# Patient Record
Sex: Female | Born: 1985
Health system: Southern US, Community
[De-identification: ages and names within clinical notes are randomized; demographics above are authoritative.]

## PROBLEM LIST (undated history)

## (undated) DIAGNOSIS — Z803 Family history of malignant neoplasm of breast: Secondary | ICD-10-CM

## (undated) HISTORY — DX: Family history of malignant neoplasm of breast: Z80.3

---

## 2017-08-19 LAB — CBC AND DIFFERENTIAL
HCT: 41 (ref 36–46)
HEMOGLOBIN: 13.4 (ref 12.0–16.0)
Platelets: 281 (ref 150–399)
WBC: 6

## 2017-08-19 LAB — BASIC METABOLIC PANEL
BUN: 8 (ref 4–21)
Creatinine: 0.8 (ref 0.5–1.1)
Glucose: 88
POTASSIUM: 4.5 (ref 3.4–5.3)
Sodium: 140 (ref 137–147)

## 2017-08-19 LAB — LIPID PANEL
CHOLESTEROL: 156 (ref 0–200)
HDL: 65 (ref 35–70)
LDL Cholesterol: 79
TRIGLYCERIDES: 59 (ref 40–160)

## 2017-08-19 LAB — HEPATIC FUNCTION PANEL
ALT: 13 (ref 7–35)
AST: 21 (ref 13–35)
Alkaline Phosphatase: 51 (ref 25–125)
Bilirubin, Total: 0.4

## 2017-08-19 LAB — TSH: TSH: 2.73 (ref 0.41–5.90)

## 2017-11-01 ENCOUNTER — Ambulatory Visit: Payer: 59 | Admitting: Family Medicine

## 2017-11-01 ENCOUNTER — Encounter: Payer: Self-pay | Admitting: Family Medicine

## 2017-11-01 VITALS — BP 110/70 | HR 71 | Temp 97.8°F | Ht 67.0 in | Wt 196.2 lb

## 2017-11-01 DIAGNOSIS — Z0000 Encounter for general adult medical examination without abnormal findings: Secondary | ICD-10-CM | POA: Insufficient documentation

## 2017-11-01 NOTE — Progress Notes (Signed)
Subjective:  Patient ID: Megan Marks, female    DOB: May 07, 1986  Age: 32 y.o. MRN: [id number]  CC: Establish Care   HPI Megan Marks presents for establishment of care and a physical exam.  Patient enjoys good health and has no history of chronic illness.  She is on no medications.  She is a Investment banker, operational.  She has a significant other.  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  She does not smoke use illicit drugs.  She exercises on a regular basis with spending and weightlifting.  She averages about 5000 steps daily in her job.  She is planning on training to run a 5K.  Her mother has elevated cholesterol.  Her father has diabetes and hypertension.  She is planning on seeing her GYN provider for her Pap and pelvic exams. History Megan Marks has no past medical history on file.   She has no past surgical history on file.   Her family history is not on file.She reports that she has never smoked. She has never used smokeless tobacco. Her alcohol and drug histories are not on file.  No outpatient medications prior to visit.   No facility-administered medications prior to visit.     ROS Review of Systems  Constitutional: Negative.   HENT: Negative.   Eyes: Negative.   Respiratory: Negative.   Cardiovascular: Negative.   Gastrointestinal: Negative.   Endocrine: Negative.   Genitourinary: Negative.   Musculoskeletal: Negative.   Allergic/Immunologic: Negative for immunocompromised state.  Neurological: Negative.   Hematological: Negative.   Psychiatric/Behavioral: Negative.     Objective:  BP 110/70   Pulse 71   Temp 97.8 F (36.6 C)   Ht 5\' 7"  (1.702 m)   Wt 196 lb 4 oz (89 kg)   LMP 10/12/2017 (Exact Date)   SpO2 97%   BMI 30.74 kg/m   Physical Exam  Constitutional: She is oriented to person, place, and time. She appears well-developed and well-nourished. No distress.  HENT:  Head:  Normocephalic and atraumatic.  Right Ear: External ear normal.  Left Ear: External ear normal.  Mouth/Throat: Oropharynx is clear and moist. No oropharyngeal exudate.  Eyes: Pupils are equal, round, and reactive to light. Conjunctivae and EOM are normal. Right eye exhibits no discharge. Left eye exhibits no discharge. No scleral icterus.  Neck: Normal range of motion. Neck supple. No JVD present. No tracheal deviation present. No thyromegaly present.  Cardiovascular: Normal rate, regular rhythm and normal heart sounds.  Pulmonary/Chest: Effort normal and breath sounds normal.  Abdominal: Bowel sounds are normal.  Musculoskeletal: She exhibits no edema or tenderness.  Lymphadenopathy:    She has no cervical adenopathy.  Neurological: She is alert and oriented to person, place, and time.  Skin: Skin is warm and dry. Capillary refill takes less than 2 seconds. She is not diaphoretic.  Psychiatric: She has a normal mood and affect. Her behavior is normal. Thought content normal.      Assessment & Plan:   Megan Marks was seen today for establish care.  Diagnoses and all orders for this visit:  Healthcare maintenance   Megan Marks does not currently have medications on file.  No orders of the defined types were placed in this encounter.  Patient brings  recent blood work drawn through work that shows normal CMP, CBC and TSH.  LDL was 79 and her HDL was 65.  Encouraged her to maintain her healthy lifestyle.  She  will follow-up in a year or as needed.  Follow-up: Return in about 1 year (around 11/02/2018), or if symptoms worsen or fail to improve.  Mliss SaxWilliam Alfred Kremer, MD

## 2017-11-22 ENCOUNTER — Other Ambulatory Visit: Payer: Self-pay | Admitting: Obstetrics and Gynecology

## 2018-07-12 NOTE — Progress Notes (Signed)
PCP:  Libby Maw, MD   Chief Complaint  Patient presents with  . Gynecologic Exam     HPI:      [name] is a 33 y.o. No obstetric history on file. who LMP was Patient's last menstrual period was 07/11/2018 (exact date)., presents today for her NP annual examination.  Her menses are Q31 days (this month was 25 days), lasting 7 days.  Dysmenorrhea mild, occurring first 1-2 days of flow. She does not have intermenstrual bleeding.  Sex activity: single partner, contraception - condoms, declines other BC. Last Pap: 3-4 yrs ago; no hx of abn.   Last mammo: none There is a FH of breast cancer in her mom and MGM, neg BRCA genetic testing in her mom about 10 yrs ago. There is no FH of ovarian cancer. The patient does do self-breast exams.  Tobacco use: The patient denies current or previous tobacco use. Alcohol use: social drinker No drug use.  Exercise: moderately active  She does get adequate calcium and Vitamin D in her diet.  Normal labs 4/19. Would like new Gard vaccine due to poss exposure with LEEP procedures.   Past Medical History:  Diagnosis Date  . Family history of breast cancer     History reviewed. No pertinent surgical history.  Family History  Problem Relation Age of Onset  . Breast cancer Mother 20       tested  . Kidney cancer Mother 67  . Hypertension Mother   . Hyperlipidemia Mother   . Hypertension Father   . Breast cancer Maternal Grandmother 8  . Prostate cancer Maternal Grandfather 62  . Lymphoma Paternal Grandmother 36  . Heart failure Paternal Grandfather     Social History   Socioeconomic History  . Marital status: Single    Spouse name: Not on file  . Number of children: Not on file  . Years of education: Not on file  . Highest education level: Not on file  Occupational History  . Not on file  Social Needs  . Financial resource strain: Not on file  . Food insecurity:    Worry: Not on file    Inability:  Not on file  . Transportation needs:    Medical: Not on file    Non-medical: Not on file  Tobacco Use  . Smoking status: Never Smoker  . Smokeless tobacco: Never Used  Substance and Sexual Activity  . Alcohol use: Yes    Alcohol/week: 1.0 standard drinks    Types: 1 Glasses of wine per week    Comment: rarely  . Drug use: Never  . Sexual activity: Yes    Birth control/protection: Condom  Lifestyle  . Physical activity:    Days per week: Not on file    Minutes per session: Not on file  . Stress: Not on file  Relationships  . Social connections:    Talks on phone: Not on file    Gets together: Not on file    Attends religious service: Not on file    Active member of club or organization: Not on file    Attends meetings of clubs or organizations: Not on file    Relationship status: Not on file  . Intimate partner violence:    Fear of current or ex partner: Not on file    Emotionally abused: Not on file    Physically abused: Not on file    Forced sexual activity: Not on file  Other Topics Concern  .  Not on file  Social History Narrative  . Not on file    No outpatient medications prior to visit.   No facility-administered medications prior to visit.       ROS:  Review of Systems  Constitutional: Negative for fatigue, fever and unexpected weight change.  Respiratory: Negative for cough, shortness of breath and wheezing.   Cardiovascular: Negative for chest pain, palpitations and leg swelling.  Gastrointestinal: Negative for blood in stool, constipation, diarrhea, nausea and vomiting.  Endocrine: Negative for cold intolerance, heat intolerance and polyuria.  Genitourinary: Negative for dyspareunia, dysuria, flank pain, frequency, genital sores, hematuria, menstrual problem, pelvic pain, urgency, vaginal bleeding, vaginal discharge and vaginal pain.  Musculoskeletal: Negative for back pain, joint swelling and myalgias.  Skin: Negative for rash.  Neurological:  Negative for dizziness, syncope, light-headedness, numbness and headaches.  Hematological: Negative for adenopathy.  Psychiatric/Behavioral: Negative for agitation, confusion, sleep disturbance and suicidal ideas. The patient is not nervous/anxious.    BREAST: No symptoms   Objective: BP 120/84   Pulse 76   Ht 5' 7"  (1.702 m)   Wt 199 lb (90.3 kg)   LMP 07/11/2018 (Exact Date)   BMI 31.17 kg/m    Physical Exam Constitutional:      Appearance: She is well-developed.  Genitourinary:     Vulva, uterus, right adnexa and left adnexa normal.     No vulval lesion or tenderness noted.     No vaginal discharge, erythema or tenderness.     Cervical bleeding present.     No cervical polyp.     Uterus is not enlarged or tender.     No right or left adnexal mass present.     Right adnexa not tender.     Left adnexa not tender.  Neck:     Musculoskeletal: Normal range of motion.     Thyroid: No thyromegaly.  Cardiovascular:     Rate and Rhythm: Normal rate and regular rhythm.     Heart sounds: Normal heart sounds. No murmur.  Pulmonary:     Effort: Pulmonary effort is normal.     Breath sounds: Normal breath sounds.  Chest:     Breasts:        Right: No mass, nipple discharge, skin change or tenderness.        Left: No mass, nipple discharge, skin change or tenderness.  Abdominal:     Palpations: Abdomen is soft.     Tenderness: There is no abdominal tenderness. There is no guarding.  Musculoskeletal: Normal range of motion.  Neurological:     Mental Status: She is alert and oriented to person, place, and time.     Cranial Nerves: No cranial nerve deficit.  Psychiatric:        Behavior: Behavior normal.  Vitals signs reviewed.     Assessment/Plan: Encounter for annual routine gynecological examination  Cervical cancer screening - Plan: Cytology - PAP  Screening for HPV (human papillomavirus) - Plan: Cytology - PAP  Family history of breast cancer - Mom is BRCA neg.  MyRisk testing offered and discussed. Pt to consider. F/u if desires  Increased risk of breast cancer - TC model done. IBIS=32.6%. Discussed yearly mammos starting age 69, alternating with scr br MRI. Pt to consider and f/u if desires.  Need for HPV vaccine - Plan: HPV vaccine quadravalent 3 dose IM           GYN counsel breast self exam, mammography screening, adequate intake of calcium and vitamin D,  diet and exercise     F/U  Return in about 2 months (around 09/12/2018).  Arcadio Cope B. Niemah Schwebke, PA-C 07/14/2018 10:59 AM

## 2018-07-14 ENCOUNTER — Encounter: Payer: Self-pay | Admitting: Obstetrics and Gynecology

## 2018-07-14 ENCOUNTER — Ambulatory Visit (INDEPENDENT_AMBULATORY_CARE_PROVIDER_SITE_OTHER): Payer: 59 | Admitting: Obstetrics and Gynecology

## 2018-07-14 ENCOUNTER — Other Ambulatory Visit (HOSPITAL_COMMUNITY)
Admission: RE | Admit: 2018-07-14 | Discharge: 2018-07-14 | Disposition: A | Discharge status: Home / Self Care | Payer: 59 | Source: Ambulatory Visit | Attending: Obstetrics and Gynecology | Admitting: Obstetrics and Gynecology

## 2018-07-14 VITALS — BP 120/84 | HR 76 | Ht 67.0 in | Wt 199.0 lb

## 2018-07-14 DIAGNOSIS — Z01419 Encounter for gynecological examination (general) (routine) without abnormal findings: Secondary | ICD-10-CM

## 2018-07-14 DIAGNOSIS — Z1151 Encounter for screening for human papillomavirus (HPV): Secondary | ICD-10-CM | POA: Diagnosis not present

## 2018-07-14 DIAGNOSIS — Z124 Encounter for screening for malignant neoplasm of cervix: Secondary | ICD-10-CM | POA: Insufficient documentation

## 2018-07-14 DIAGNOSIS — Z9189 Other specified personal risk factors, not elsewhere classified: Secondary | ICD-10-CM

## 2018-07-14 DIAGNOSIS — Z23 Encounter for immunization: Secondary | ICD-10-CM

## 2018-07-14 DIAGNOSIS — Z803 Family history of malignant neoplasm of breast: Secondary | ICD-10-CM

## 2018-07-14 NOTE — Patient Instructions (Signed)
I value your feedback and entrusting us with your care. If you get a [facility] patient survey, I would appreciate you taking the time to let us know about your experience today. Thank you! 

## 2018-07-17 LAB — CYTOLOGY - PAP
Diagnosis: NEGATIVE
HPV: NOT DETECTED

## 2018-07-25 ENCOUNTER — Other Ambulatory Visit: Payer: Self-pay | Admitting: Maternal Newborn

## 2018-07-25 DIAGNOSIS — N760 Acute vaginitis: Principal | ICD-10-CM

## 2018-07-25 DIAGNOSIS — B9689 Other specified bacterial agents as the cause of diseases classified elsewhere: Secondary | ICD-10-CM

## 2018-07-25 MED ORDER — SECNIDAZOLE 2 G PO PACK: 1.0000 | PACK | Freq: Once | ORAL | 0 refills | Status: DC

## 2018-09-18 ENCOUNTER — Ambulatory Visit (INDEPENDENT_AMBULATORY_CARE_PROVIDER_SITE_OTHER): Payer: 59

## 2018-09-18 ENCOUNTER — Other Ambulatory Visit: Payer: Self-pay

## 2018-09-18 DIAGNOSIS — Z23 Encounter for immunization: Secondary | ICD-10-CM

## 2018-12-29 DIAGNOSIS — L858 Other specified epidermal thickening: Secondary | ICD-10-CM | POA: Diagnosis not present

## 2018-12-29 DIAGNOSIS — B078 Other viral warts: Secondary | ICD-10-CM | POA: Diagnosis not present

## 2018-12-29 DIAGNOSIS — L817 Pigmented purpuric dermatosis: Secondary | ICD-10-CM | POA: Diagnosis not present

## 2019-01-28 ENCOUNTER — Ambulatory Visit (INDEPENDENT_AMBULATORY_CARE_PROVIDER_SITE_OTHER): Payer: 59

## 2019-01-28 ENCOUNTER — Other Ambulatory Visit: Payer: Self-pay

## 2019-01-28 DIAGNOSIS — Z23 Encounter for immunization: Secondary | ICD-10-CM

## 2019-02-11 ENCOUNTER — Other Ambulatory Visit: Payer: Self-pay | Admitting: Obstetrics and Gynecology

## 2019-03-09 DIAGNOSIS — B070 Plantar wart: Secondary | ICD-10-CM | POA: Diagnosis not present

## 2019-03-09 DIAGNOSIS — L858 Other specified epidermal thickening: Secondary | ICD-10-CM | POA: Diagnosis not present

## 2019-04-07 DIAGNOSIS — L858 Other specified epidermal thickening: Secondary | ICD-10-CM | POA: Diagnosis not present

## 2019-04-07 DIAGNOSIS — B070 Plantar wart: Secondary | ICD-10-CM | POA: Diagnosis not present

## 2019-05-04 DIAGNOSIS — B079 Viral wart, unspecified: Secondary | ICD-10-CM | POA: Diagnosis not present

## 2019-05-04 DIAGNOSIS — L858 Other specified epidermal thickening: Secondary | ICD-10-CM | POA: Diagnosis not present

## 2019-07-13 ENCOUNTER — Ambulatory Visit: Payer: 59 | Admitting: Obstetrics and Gynecology

## 2019-07-13 ENCOUNTER — Other Ambulatory Visit (HOSPITAL_COMMUNITY)
Admission: RE | Admit: 2019-07-13 | Discharge: 2019-07-13 | Disposition: A | Discharge status: Home / Self Care | Payer: 59 | Source: Ambulatory Visit | Attending: Obstetrics and Gynecology | Admitting: Obstetrics and Gynecology

## 2019-07-13 ENCOUNTER — Other Ambulatory Visit: Payer: Self-pay

## 2019-07-13 ENCOUNTER — Other Ambulatory Visit: Payer: Self-pay | Admitting: Obstetrics and Gynecology

## 2019-07-13 DIAGNOSIS — Z113 Encounter for screening for infections with a predominantly sexual mode of transmission: Secondary | ICD-10-CM | POA: Insufficient documentation

## 2019-07-13 DIAGNOSIS — N930 Postcoital and contact bleeding: Secondary | ICD-10-CM

## 2019-07-13 MED ORDER — DOXYCYCLINE HYCLATE 100 MG PO CAPS: 100.0000 mg | ORAL_CAPSULE | Freq: Two times a day (BID) | ORAL | 0 refills | Status: DC

## 2019-07-13 NOTE — Progress Notes (Signed)
Pt with a couple episodes red postcoital bleeding, no dyspareunia. No other vag sx. Pap neg/neg HPV DAN 2/20. No new sex partners, using condoms. Wants STD testing. Menses monthly, no AUBs.  Cx bled with cervical brush, no endocx polyp at os. Neg pelvic exam. Check STDs, treat with doxy. Rx eRxd. F/u prn.

## 2019-07-15 LAB — CERVICOVAGINAL ANCILLARY ONLY
Chlamydia: NEGATIVE
Comment: NEGATIVE
Comment: NORMAL
Neisseria Gonorrhea: NEGATIVE

## 2019-11-02 ENCOUNTER — Telehealth: Payer: Self-pay | Admitting: Family Medicine

## 2019-11-02 NOTE — Telephone Encounter (Signed)
Patient sent mychart request to schedule for annual. Called patient and LVM for patient to call the office.

## 2019-11-24 ENCOUNTER — Other Ambulatory Visit: Payer: Self-pay

## 2019-11-25 ENCOUNTER — Ambulatory Visit (INDEPENDENT_AMBULATORY_CARE_PROVIDER_SITE_OTHER): Payer: 59 | Admitting: Family Medicine

## 2019-11-25 ENCOUNTER — Encounter: Payer: Self-pay | Admitting: Family Medicine

## 2019-11-25 VITALS — BP 112/78 | HR 69 | Temp 98.1°F | Ht 67.25 in | Wt 203.4 lb

## 2019-11-25 DIAGNOSIS — F439 Reaction to severe stress, unspecified: Secondary | ICD-10-CM

## 2019-11-25 NOTE — Progress Notes (Signed)
New Patient Office Visit  Subjective:  Patient ID: Megan Marks, female    DOB: 05-16-1986  Age: 34 y.o. MRN: [id number]  CC:  Chief Complaint  Patient presents with  . Annual Exam    Patient is here today for a CPE. She was seen by GYN on 2.22.21. She brought in a copy of her fasting labs completed on 9.23.2020.  She is UTD with all vaccines. She is not currently fasting.    HPI Megan Marks presents for an exam and a discussion of personal stress.  Recent GYN exam in February.  Brings in lab values obtained at that time.  Normal CMP LDL cholesterol 85 with an HDL of 53.  CBC was normal.  Patient is exercising.  She lives with her significant other and they have a good relationship.  She does not smoke or use illicit drugs.  She rarely drinks alcohol.  Past Medical History:  Diagnosis Date  . Family history of breast cancer     No past surgical history on file.  Family History  Problem Relation Age of Onset  . Breast cancer Mother 91       tested  . Kidney cancer Mother 38  . Hypertension Mother   . Hyperlipidemia Mother   . Hypertension Father   . Breast cancer Maternal Grandmother 37  . Prostate cancer Maternal Grandfather 61  . Lymphoma Paternal Grandmother 15  . Heart failure Paternal Grandfather     Social History   Socioeconomic History  . Marital status: Single    Spouse name: Not on file  . Number of children: Not on file  . Years of education: Not on file  . Highest education level: Not on file  Occupational History  . Not on file  Tobacco Use  . Smoking status: Never Smoker  . Smokeless tobacco: Never Used  Vaping Use  . Vaping Use: Never used  Substance and Sexual Activity  . Alcohol use: Yes    Alcohol/week: 1.0 standard drink    Types: 1 Glasses of wine per week    Comment: rarely  . Drug use: Never  . Sexual activity: Yes    Birth control/protection: Condom  Other Topics Concern  . Not on file  Social History Narrative  . Not  on file   Social Determinants of Health   Financial Resource Strain:   . Difficulty of Paying Living Expenses:   Food Insecurity:   . Worried About Programme researcher, broadcasting/film/video in the Last Year:   . Barista in the Last Year:   Transportation Needs:   . Freight forwarder (Medical):   Marland Kitchen Lack of Transportation (Non-Medical):   Physical Activity:   . Days of Exercise per Week:   . Minutes of Exercise per Session:   Stress:   . Feeling of Stress :   Social Connections:   . Frequency of Communication with Friends and Family:   . Frequency of Social Gatherings with Friends and Family:   . Attends Religious Services:   . Active Member of Clubs or Organizations:   . Attends Banker Meetings:   Marland Kitchen Marital Status:   Intimate Partner Violence:   . Fear of Current or Ex-Partner:   . Emotionally Abused:   Marland Kitchen Physically Abused:   . Sexually Abused:     ROS Review of Systems  Constitutional: Negative.   HENT: Negative.   Eyes: Negative for photophobia and visual disturbance.  Respiratory: Negative.   Cardiovascular:  Negative.   Gastrointestinal: Negative.   Endocrine: Negative for polyphagia and polyuria.  Genitourinary: Negative.   Musculoskeletal: Negative for gait problem and joint swelling.  Skin: Negative for pallor and rash.   Depression screen PHQ 2/9 11/25/2019  Decreased Interest 1  Down, Depressed, Hopeless 1  PHQ - 2 Score 2  Altered sleeping 0  Tired, decreased energy 1  Change in appetite 0  Feeling bad or failure about yourself  1  Trouble concentrating 0  Moving slowly or fidgety/restless 0  Suicidal thoughts 0  PHQ-9 Score 4  Difficult doing work/chores Not difficult at all    Objective:   Today's Vitals: BP 112/78 (BP Location: Left Arm, Patient Position: Sitting, Cuff Size: Normal)   Pulse 69   Temp 98.1 F (36.7 C) (Temporal)   Ht 5' 7.25" (1.708 m)   Wt 203 lb 6.4 oz (92.3 kg)   LMP 11/09/2019   SpO2 97%   BMI 31.62 kg/m    Physical Exam Vitals and nursing note reviewed.  Constitutional:      General: She is not in acute distress.    Appearance: Normal appearance. She is not ill-appearing, toxic-appearing or diaphoretic.  HENT:     Head: Normocephalic and atraumatic.     Right Ear: External ear normal.     Left Ear: External ear normal.  Eyes:     General: No scleral icterus.       Right eye: No discharge.        Left eye: No discharge.     Extraocular Movements: Extraocular movements intact.     Conjunctiva/sclera: Conjunctivae normal.  Pulmonary:     Effort: Pulmonary effort is normal.  Neurological:     Mental Status: She is alert and oriented to person, place, and time.  Psychiatric:        Mood and Affect: Mood normal.        Behavior: Behavior normal.     Assessment & Plan:   Problem List Items Addressed This Visit      Other   Stress - Primary      Outpatient Encounter Medications as of 11/25/2019  Medication Sig  . doxycycline (VIBRAMYCIN) 100 MG capsule Take 1 capsule (100 mg total) by mouth 2 (two) times daily.   No facility-administered encounter medications on file as of 11/25/2019.    Follow-up: Return if symptoms worsen or fail to improve.   We discussed coping skills including talking therapy and medications.  We will hold off for now.  We developed a plan of action for her and she will pursue that.  Mliss Sax, MD

## 2019-11-30 ENCOUNTER — Other Ambulatory Visit: Payer: Self-pay

## 2019-11-30 ENCOUNTER — Encounter: Payer: Self-pay | Admitting: Obstetrics and Gynecology

## 2019-11-30 ENCOUNTER — Ambulatory Visit (INDEPENDENT_AMBULATORY_CARE_PROVIDER_SITE_OTHER): Payer: 59 | Admitting: Obstetrics and Gynecology

## 2019-11-30 VITALS — BP 118/79 | HR 64 | Ht 67.0 in | Wt 202.5 lb

## 2019-11-30 DIAGNOSIS — N898 Other specified noninflammatory disorders of vagina: Secondary | ICD-10-CM

## 2019-11-30 DIAGNOSIS — K644 Residual hemorrhoidal skin tags: Secondary | ICD-10-CM

## 2019-11-30 DIAGNOSIS — N9089 Other specified noninflammatory disorders of vulva and perineum: Secondary | ICD-10-CM | POA: Diagnosis not present

## 2019-11-30 MED ORDER — HYDROCORTISONE ACETATE 25 MG RE SUPP: 25.0000 mg | Freq: Two times a day (BID) | RECTAL | 1 refills | Status: DC

## 2019-11-30 MED ORDER — CLOBETASOL PROPIONATE 0.05 % EX OINT: TOPICAL_OINTMENT | CUTANEOUS | 1 refills | Status: DC

## 2019-11-30 NOTE — Progress Notes (Signed)
GYNECOLOGY PROGRESS NOTE  Subjective:    Patient ID: Megan Marks, female    DOB: 1985-10-26, 34 y.o.   MRN: [id number]  HPI  Patient is a 34 y.o. G0P0000 female who presents for complaints of vulvar irritation for the past 1.5 months, with worsening symptoms over the past several weeks. She initially thought it might be a yeast infection or BV infection (had some occasional mild vaginal irritation), and so she self-treated, however her symptoms did not go away. 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  She reports that her vulva becomes more irritated in the evenings, and worsens after scratching.  She has been attempting to manage symptoms currently with Benadryl, Vaseline, neosporin, powder, and even ice with no relief.  It is now affecting her life daily.   Additionally, she reports that she has been dealing with hemorrhoids. She tries to consume more water to reduce risk of constipation, topically applies Preparation-H, but with her vulvar symptoms, feels like this may be decreasing the response.    GYN History:  Patient's last menstrual period was 11/26/2019. Denies h/o STI's. Currently in a relationship, same partner (female x 11 years) Last pap smear: 07/14/2018. Results were: normal.    OB History  Gravida Para Term Preterm AB Living  0 0 0 0 0 0  SAB TAB Ectopic Multiple Live Births  0 0 0 0 0     Past Medical History:  Diagnosis Date  . Family history of breast cancer     Family History  Problem Relation Age of Onset  . Breast cancer Mother 70       tested  . Kidney cancer Mother 45  . Hypertension Mother   . Hyperlipidemia Mother   . Hypertension Father   . Breast cancer Maternal Grandmother 38  . Prostate cancer Maternal Grandfather 18  . Lymphoma Paternal Grandmother 96    . Heart failure Paternal Grandfather     History reviewed. No pertinent surgical history.  Social History   Socioeconomic History  . Marital status: Single    Spouse name: Not on file  . Number of children: Not on file  . Years of education: Not on file  . Highest education level: Not on file  Occupational History  . Not on file  Tobacco Use  . Smoking status: Never Smoker  . Smokeless tobacco: Never Used  Vaping Use  . Vaping Use: Never used  Substance and Sexual Activity  . Alcohol use: Yes    Alcohol/week: 1.0 standard drink    Types: 1 Glasses of wine per week    Comment: rarely  . Drug use: Never  . Sexual activity: Yes    Birth control/protection: Condom  Other Topics Concern  . Not on file  Social History Narrative  . Not on file   Social Determinants of Health   Financial Resource Strain:   . Difficulty of Paying Living Expenses:   Food Insecurity:   . Worried About Programme researcher, broadcasting/film/video in the Last Year:   . Barista in the Last Year:   Transportation Needs:   . Freight forwarder (Medical):   Marland Kitchen Lack of Transportation (Non-Medical):   Physical Activity:   . Days of Exercise per Week:   . Minutes of  Exercise per Session:   Stress:   . Feeling of Stress :   Social Connections:   . Frequency of Communication with Friends and Family:   . Frequency of Social Gatherings with Friends and Family:   . Attends Religious Services:   . Active Member of Clubs or Organizations:   . Attends Banker Meetings:   Marland Kitchen Marital Status:   Intimate Partner Violence:   . Fear of Current or Ex-Partner:   . Emotionally Abused:   Marland Kitchen Physically Abused:   . Sexually Abused:     No current outpatient medications on file prior to visit.   No current facility-administered medications on file prior to visit.    Allergies  Allergen Reactions  . Sulfa Antibiotics Other (See Comments)    As child- rash?    Review of Systems Pertinent items noted in  HPI and remainder of comprehensive ROS otherwise negative.   Objective:   Blood pressure 118/79, pulse 64, height 5\' 7"  (1.702 m), weight 202 lb 8 oz (91.9 kg), last menstrual period 11/26/2019. General appearance: alert and no acute distress Pelvis: external genitalia with erythematous rash of bilateral labia minora and majora, extending to rectal area. Several small fissures noted at perineum (posterior fourchette) and on left labia minora. Several scattered papules along the edges of erythematous regions of the labia majora bilaterally extending towards the groin. Hemorrhoid skin tag noted at 12 o'clock on rectum. Speculum and bimanual exam deferred (Diva cup in place).  Extremities: extremities normal, atraumatic, no cyanosis or edema  Assessment:   Vulvar irritation Vaginal irritation Hemorrhoids  Plan:   1)  Vulvar irritation - appears to be some form of vulvar dermatosis, suggestive most of lichen simplex chronicus by visualization and history of chronic "scratch/itch" cycles. Will prescribe Clobetasol for treatment. Advised on BID use for 1-2 weeks, then decrease to daily as needed.  2) Vaginal irritation - noted on occasion, attempted to self-treat for BV and yeast.  Not having symptoms currently but would like to be checked.  Allowed patient to self-collect Nuswab as she noted that she had her Diva cup in place as she is currently menstruating. Is not concerned for STD exposure, declines testing.  3. Patient can continue use of Preparation-H, can utilize warm compresses/sitz baths. Will also prescribe Anusol in case OTC remedies are no longer effective.  Patient to f/u in 1 month to reassess symptoms and repeat vulvar exam to assess for clinical improvement.    01/27/2020, MD [facility]

## 2019-11-30 NOTE — Patient Instructions (Signed)
Lichen Simplex Chronicus Lichen sclerosus is a skin problem. It can happen on any part of the body, but it commonly involves the anal or genital areas. It can cause itching and discomfort in these areas. Treatment can help to control symptoms. When the genital area is affected, getting treatment is important because the condition can cause scarring that may lead to other problems. What are the causes? The cause of this condition is not known. It may be related to an overactive immune system or a lack of certain hormones. Lichen sclerosus is not an infection or a fungus, and it is not passed from one person to another (not contagious). What increases the risk? This condition is more likely to develop in women, usually after menopause. What are the signs or symptoms? Symptoms of this condition include:  Thin, wrinkled, white areas on the skin.  Thickened white areas on the skin.  Red and swollen patches (lesions) on the skin.  Tears or cracks in the skin.  Bruising.  Blood blisters.  Severe itching.  Pain, itching, or burning when urinating. Constipation is also common in people with lichen sclerosus. How is this diagnosed? This condition may be diagnosed with a physical exam. In some cases, a tissue sample (biopsy sample) may be removed to be looked at under a microscope. How is this treated? This condition is usually treated with medicated creams or ointments (topical steroids) that are applied over the affected areas. In some cases, treatment may also include medicines that are taken by mouth. Surgery may be needed in more severe cases that are causing problems such as scarring. Follow these instructions at home:  Take or use over-the-counter and prescription medicines only as told by your health care provider.  Use creams or ointments as told by your health care provider.  Do not scratch the affected areas of skin.  If you are a woman, be sure to keep the vaginal area as clean  and dry as possible.  Clean the affected area of skin gently with water. Avoid using rough towels or toilet paper.  Keep all follow-up visits as told by your health care provider. This is important. Contact a health care provider if:  You have increasing redness, swelling, or pain in the affected area.  You have fluid, blood, or pus coming from the affected area.  You have new lesions on your skin.  You have a fever.  You have pain during sex. Summary  Lichen sclerosus is a skin problem. When the genital area is affected, getting treatment is important because the condition can cause scarring that may lead to other problems.  This condition is usually treated with medicated creams or ointments (topical steroids) that are applied over the affected areas.  Take or use over-the-counter and prescription medicines only as told by your health care provider.  Contact a health care provider if you have new lesions on your skin, have pain during sex, or have increasing redness, swelling, or pain in the affected area.  Keep all follow-up visits as told by your health care provider. This is important. This information is not intended to replace advice given to you by your health care provider. Make sure you discuss any questions you have with your health care provider. Document Revised: 09/19/2017 Document Reviewed: 09/19/2017 Elsevier Patient Education  2020 [employer].

## 2019-12-02 LAB — NUSWAB VAGINITIS (VG)
Candida albicans, NAA: POSITIVE — AB
Candida glabrata, NAA: NEGATIVE
Trich vag by NAA: NEGATIVE

## 2019-12-02 MED ORDER — FLUCONAZOLE 150 MG PO TABS: 150.0000 mg | ORAL_TABLET | Freq: Once | ORAL | 3 refills | Status: AC

## 2019-12-02 NOTE — Addendum Note (Signed)
Addended by: Fabian November on: 12/02/2019 01:49 PM   Modules accepted: Orders

## 2019-12-28 ENCOUNTER — Other Ambulatory Visit
Admission: RE | Admit: 2019-12-28 | Discharge: 2019-12-28 | Disposition: A | Discharge status: Home / Self Care | Payer: 59 | Source: Ambulatory Visit | Attending: Advanced Practice Midwife | Admitting: Advanced Practice Midwife

## 2019-12-28 DIAGNOSIS — J029 Acute pharyngitis, unspecified: Secondary | ICD-10-CM | POA: Insufficient documentation

## 2019-12-28 DIAGNOSIS — Z20822 Contact with and (suspected) exposure to covid-19: Secondary | ICD-10-CM | POA: Diagnosis not present

## 2019-12-28 LAB — SARS CORONAVIRUS 2 BY RT PCR (HOSPITAL ORDER, PERFORMED IN ~~LOC~~ HOSPITAL LAB): SARS Coronavirus 2: NEGATIVE

## 2019-12-30 ENCOUNTER — Encounter: Payer: 59 | Admitting: Obstetrics and Gynecology

## 2020-01-27 ENCOUNTER — Encounter: Payer: Self-pay | Admitting: Obstetrics and Gynecology

## 2020-01-27 ENCOUNTER — Ambulatory Visit: Payer: 59 | Admitting: Obstetrics and Gynecology

## 2020-01-27 ENCOUNTER — Encounter: Payer: 59 | Admitting: Obstetrics and Gynecology

## 2020-01-27 ENCOUNTER — Other Ambulatory Visit: Payer: Self-pay

## 2020-01-27 VITALS — BP 118/81 | HR 60 | Ht 67.0 in | Wt 203.7 lb

## 2020-01-27 DIAGNOSIS — R928 Other abnormal and inconclusive findings on diagnostic imaging of breast: Secondary | ICD-10-CM

## 2020-01-27 DIAGNOSIS — L280 Lichen simplex chronicus: Secondary | ICD-10-CM | POA: Diagnosis not present

## 2020-01-27 DIAGNOSIS — B373 Candidiasis of vulva and vagina: Secondary | ICD-10-CM | POA: Diagnosis not present

## 2020-01-27 DIAGNOSIS — B3731 Acute candidiasis of vulva and vagina: Secondary | ICD-10-CM

## 2020-01-27 DIAGNOSIS — Z803 Family history of malignant neoplasm of breast: Secondary | ICD-10-CM | POA: Diagnosis not present

## 2020-01-27 MED ORDER — FLUCONAZOLE 150 MG PO TABS: 150.0000 mg | ORAL_TABLET | ORAL | 1 refills | Status: DC

## 2020-01-27 NOTE — Progress Notes (Signed)
     GYNECOLOGY PROGRESS NOTE  Subjective:    Patient ID: Megan Marks, female    DOB: 1985-09-14, 34 y.o.   MRN: [id number]  HPI  Patient is a 34 y.o. G0P0000 female who presents for follow up of suspected lichen simplex chronicus (based on symptoms and visualization, no biopsy performed).  She reports significant improvement in her symptoms with use of the Clobetasol treatment, no longer experiencing the scratch/itch cycles.  She also notes that she has been having a recurrence of yeast infections (last infection noted on Nuswab performed in July).    Megan Marks also notes that she needs to begin having mammograms.  She reports an elevated lifetime risk of cancer (20.5% based on Tyrer-Cuzick model). Review of her family history, her mother had breast cancer at age 53, and had negative BRCA testing.Her MGM died of breast cancer at age 38.  Additionally, she has a maternal aunt who had breast cancer, age 41, with double mastectomy.   The following portions of the patient's history were reviewed and updated as appropriate: allergies, current medications, past family history, past medical history, past social history, past surgical history and problem list.  Review of Systems Pertinent items noted in HPI and remainder of comprehensive ROS otherwise negative.   Objective:   Blood pressure 118/81, pulse 60, height $RemoveBe'5\' 7"'qzfqVbwrp$  (1.702 m), weight 203 lb 11.2 oz (92.4 kg), last menstrual period 01/19/2020. General appearance: alert and no distress  Breasts:  breasts appear normal, no suspicious masses, no skin or nipple changes or axillary nodes. Pelvic: external genitalia appears normal, erythematous region has resolved. Internal exam not performed.  Extremities: overall appear normal, no swelling or edema.  Neurologic: Grossly normal   Assessment:   Lichen simplex chronicus Recurrent candidiasis of vagina Family history of breast cancer   Plan:   1. Lichen simplex chronicus - significantly  improved. Can use Clobetasol as needed if symptoms reoccur.  2. Recurrent candidiasis - will prescribe 6 month prophylactic dosing for recurrent candidiasis. To take 1 tablet weekly. Patient aware of hygiene practices.  3. Family history of breast cancer - will order for patient to have baseline mammogram due to increased lifetime risk. Typically needs to begin q 6 month alternating screens with mammogram and MRI 10 years prior to youngest relative with breast cancer.  Will order MRI for 6 months after mammogram completed.    Return to clinic for any scheduled appointments or for any gynecologic concerns as needed.    Rubie Maid, MD [facility]

## 2020-01-27 NOTE — Progress Notes (Signed)
Pt present for f/u for lichens chronicus. Pt stated that she has noticed some improvement since her last visit.

## 2020-01-27 NOTE — Patient Instructions (Signed)
Mammogram A mammogram is a low energy X-ray of the breasts that is done to check for abnormal changes. This procedure can screen for and detect any changes that may indicate breast cancer. Mammograms are regularly done on women. A man may have a mammogram if he has a lump or swelling in his breast. A mammogram can also identify other changes and variations in the breast, such as:  Inflammation of the breast tissue (mastitis).  An infected area that contains a collection of pus (abscess).  A fluid-filled sac (cyst).  Fibrocystic changes. This is when breast tissue becomes denser, which can make the tissue feel rope-like or uneven under the skin.  Tumors that are not cancerous (benign). Tell a health care provider:  About any allergies you have.  If you have breast implants.  If you have had previous breast disease, biopsy, or surgery.  If you are breastfeeding.  If you are younger than age 25.  If you have a family history of breast cancer.  Whether you are pregnant or may be pregnant. What are the risks? Generally, this is a safe procedure. However, problems may occur, including:  Exposure to radiation. Radiation levels are very low with this test.  The results being misinterpreted.  The need for further tests.  The inability of the mammogram to detect certain cancers. What happens before the procedure?  Schedule your test about 1-2 weeks after your menstrual period if you are still menstruating. This is usually when your breasts are the least tender.  If you have had a mammogram done at a different facility in the past, get the mammogram X-rays or have them sent to your current exam facility. The new and old images will be compared.  Wash your breasts and underarms on the day of the test.  Do not wear deodorants, perfumes, lotions, or powders anywhere on your body on the day of the test.  Remove any jewelry from your neck.  Wear clothes that you can change into and  out of easily. What happens during the procedure?   You will undress from the waist up and put on a gown that opens in the front.  You will stand in front of the X-ray machine.  Each breast will be placed between two plastic or glass plates. The plates will compress your breast for a few seconds. Try to stay as relaxed as possible during the procedure. This does not cause any harm to your breasts and any discomfort you feel will be very brief.  X-rays will be taken from different angles of each breast. The procedure may vary among health care providers and hospitals. What happens after the procedure?  The mammogram will be examined by a specialist (radiologist).  You may need to repeat certain parts of the test, depending on the quality of the images. This is commonly done if the radiologist needs a better view of the breast tissue.  You may resume your normal activities.  It is up to you to get the results of your procedure. Ask your health care provider, or the department that is doing the procedure, when your results will be ready. Summary  A mammogram is a low energy X-ray of the breasts that is done to check for abnormal changes. A man may have a mammogram if he has a lump or swelling in his breast.  If you have had a mammogram done at a different facility in the past, get the mammogram X-rays or have them sent to your   current exam facility in order to compare them.  Schedule your test about 1-2 weeks after your menstrual period if you are still menstruating.  For this test, each breast will be placed between two plastic or glass plates. The plates will compress your breast for a few seconds.  Ask when your test results will be ready. Make sure you get your test results. This information is not intended to replace advice given to you by your health care provider. Make sure you discuss any questions you have with your health care provider. Document Revised: 12/26/2017 Document  Reviewed: 12/26/2017 Elsevier Patient Education  2020 [employer]  

## 2020-01-28 ENCOUNTER — Encounter: Payer: Self-pay | Admitting: Obstetrics and Gynecology

## 2020-02-10 ENCOUNTER — Other Ambulatory Visit: Payer: Self-pay

## 2020-02-10 ENCOUNTER — Ambulatory Visit
Admission: RE | Admit: 2020-02-10 | Discharge: 2020-02-10 | Disposition: A | Discharge status: Home / Self Care | Payer: 59 | Source: Ambulatory Visit | Attending: Obstetrics and Gynecology | Admitting: Obstetrics and Gynecology

## 2020-02-10 DIAGNOSIS — Z1231 Encounter for screening mammogram for malignant neoplasm of breast: Secondary | ICD-10-CM | POA: Diagnosis not present

## 2020-02-10 DIAGNOSIS — Z803 Family history of malignant neoplasm of breast: Secondary | ICD-10-CM | POA: Insufficient documentation

## 2020-02-13 NOTE — Addendum Note (Signed)
Addended by: Fabian November on: 02/13/2020 12:01 PM   Modules accepted: Orders

## 2020-02-16 ENCOUNTER — Other Ambulatory Visit: Payer: Self-pay

## 2020-03-04 ENCOUNTER — Ambulatory Visit
Admission: RE | Admit: 2020-03-04 | Discharge: 2020-03-04 | Disposition: A | Discharge status: Home / Self Care | Payer: 59 | Source: Ambulatory Visit | Attending: Obstetrics and Gynecology | Admitting: Obstetrics and Gynecology

## 2020-03-04 ENCOUNTER — Other Ambulatory Visit: Payer: Self-pay

## 2020-03-04 DIAGNOSIS — Z803 Family history of malignant neoplasm of breast: Secondary | ICD-10-CM

## 2020-03-04 DIAGNOSIS — R928 Other abnormal and inconclusive findings on diagnostic imaging of breast: Secondary | ICD-10-CM

## 2020-03-04 DIAGNOSIS — N6489 Other specified disorders of breast: Secondary | ICD-10-CM | POA: Diagnosis not present

## 2020-03-05 ENCOUNTER — Other Ambulatory Visit: Payer: Self-pay | Admitting: Obstetrics and Gynecology

## 2020-03-05 DIAGNOSIS — R928 Other abnormal and inconclusive findings on diagnostic imaging of breast: Secondary | ICD-10-CM

## 2020-07-12 DIAGNOSIS — Z803 Family history of malignant neoplasm of breast: Secondary | ICD-10-CM

## 2020-07-12 DIAGNOSIS — Z1239 Encounter for other screening for malignant neoplasm of breast: Secondary | ICD-10-CM

## 2020-08-19 ENCOUNTER — Ambulatory Visit
Admission: RE | Admit: 2020-08-19 | Discharge: 2020-08-19 | Disposition: A | Discharge status: Home / Self Care | Payer: 59 | Source: Ambulatory Visit | Attending: Obstetrics and Gynecology | Admitting: Obstetrics and Gynecology

## 2020-08-19 DIAGNOSIS — N6489 Other specified disorders of breast: Secondary | ICD-10-CM | POA: Diagnosis not present

## 2020-08-19 DIAGNOSIS — Z803 Family history of malignant neoplasm of breast: Secondary | ICD-10-CM

## 2020-08-19 DIAGNOSIS — Z1239 Encounter for other screening for malignant neoplasm of breast: Secondary | ICD-10-CM

## 2020-08-19 MED ORDER — GADOBUTROL 1 MMOL/ML IV SOLN
9.0000 mL | Freq: Once | INTRAVENOUS | Status: AC | PRN
  Administered 2020-08-19: 9 mL via INTRAVENOUS

## 2021-02-01 ENCOUNTER — Telehealth: Payer: Self-pay | Admitting: Obstetrics and Gynecology

## 2021-02-01 DIAGNOSIS — Z803 Family history of malignant neoplasm of breast: Secondary | ICD-10-CM

## 2021-02-01 DIAGNOSIS — R928 Other abnormal and inconclusive findings on diagnostic imaging of breast: Secondary | ICD-10-CM

## 2021-02-01 DIAGNOSIS — Z1239 Encounter for other screening for malignant neoplasm of breast: Secondary | ICD-10-CM

## 2021-02-01 NOTE — Telephone Encounter (Signed)
Pt called today to schedule her physical- schedule pt for the next available appointment which is 05-31-2021, pt is requesting order for mammogram to keep them on track as it is due in October. Please Advise.

## 2021-02-02 NOTE — Telephone Encounter (Signed)
Spoke to pt to make sure she was not having any breast issues before placing the mammogram order. Pt declined any breast issues at this time and getting mammogram due to family hx of breast cancer.

## 2021-03-29 ENCOUNTER — Other Ambulatory Visit: Payer: Self-pay

## 2021-03-29 ENCOUNTER — Encounter: Payer: Self-pay | Admitting: Family Medicine

## 2021-03-29 ENCOUNTER — Ambulatory Visit: Payer: 59 | Admitting: Family Medicine

## 2021-03-29 VITALS — BP 104/72 | HR 58 | Temp 97.4°F | Ht 68.0 in | Wt 202.8 lb

## 2021-03-29 DIAGNOSIS — Z0000 Encounter for general adult medical examination without abnormal findings: Secondary | ICD-10-CM | POA: Diagnosis not present

## 2021-03-29 NOTE — Progress Notes (Signed)
Established Patient Office Visit  Subjective:  Patient ID: Megan Marks, female    DOB: [birth year]  Age: 35 y.o. MRN: [id number]  CC:  Chief Complaint  Patient presents with   Annual Exam    CPE, no concerns. Patient fasting.     HPI Megan Marks presents for her yearly physical exam.  She is doing well.  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  She is exercising regularly.  Has regular GYN checkups.  She is in a stable relationship.  Rarely drinks alcohol and does not smoke or use illicit drugs.  Recent blood work to include CMP, CBC, lipid profile and UA were all normal.  Past Medical History:  Diagnosis Date   Family history of breast cancer     History reviewed. No pertinent surgical history.  Family History  Problem Relation Age of Onset   Breast cancer Mother 61       tested   Kidney cancer Mother 56   Hypertension Mother    Hyperlipidemia Mother    Hypertension Father    Breast cancer Maternal Aunt 43   Breast cancer Maternal Grandmother 30   Prostate cancer Maternal Grandfather 16   Lymphoma Paternal Grandmother 79   Heart failure Paternal Grandfather     Social History   Socioeconomic History   Marital status: Single    Spouse name: Not on file   Number of children: Not on file   Years of education: Not on file   Highest education level: Not on file  Occupational History   Not on file  Tobacco Use   Smoking status: Never   Smokeless tobacco: Never  Vaping Use   Vaping Use: Never used  Substance and Sexual Activity   Alcohol use: Yes    Alcohol/week: 1.0 standard drink    Types: 1 Glasses of wine per week    Comment: rarely   Drug use: Never   Sexual activity: Yes    Birth control/protection: Condom  Other Topics Concern   Not on file  Social History Narrative   Not on file   Social Determinants of Health   Financial Resource Strain: Not on file  Food Insecurity: Not on file  Transportation Needs:  Not on file  Physical Activity: Not on file  Stress: Not on file  Social Connections: Not on file  Intimate Partner Violence: Not on file    Outpatient Medications Prior to Visit  Medication Sig Dispense Refill   clobetasol ointment (TEMOVATE) 0.05 % Use twice daily for 1-2 weeks, then decrease to daily as needed. (Patient not taking: Reported on 01/27/2020) 30 g 1   fluconazole (DIFLUCAN) 150 MG tablet Take 1 tablet (150 mg total) by mouth once a week. Can use for a total of 6 months. 12 tablet 1   hydrocortisone (ANUSOL-HC) 25 MG suppository Place 1 suppository (25 mg total) rectally 2 (two) times daily. (Patient not taking: Reported on 01/27/2020) 12 suppository 1   No facility-administered medications prior to visit.    Allergies  Allergen Reactions   Sulfa Antibiotics Other (See Comments)    As child- rash?    ROS Review of Systems  Constitutional:  Negative for chills, diaphoresis, fatigue, fever and unexpected weight change.  HENT: Negative.    Eyes:  Negative for photophobia.  Respiratory: Negative.    Cardiovascular: Negative.   Gastrointestinal: Negative.   Genitourinary:  Negative for difficulty urinating, frequency and urgency.  Musculoskeletal:  Negative for gait problem and  joint swelling.  Neurological:  Negative for speech difficulty and weakness.     Depression screen [facility] 2/9 03/29/2021 11/25/2019  Decreased Interest 0 1  Down, Depressed, Hopeless 0 1  PHQ - 2 Score 0 2  Altered sleeping - 0  Tired, decreased energy - 1  Change in appetite - 0  Feeling bad or failure about yourself  - 1  Trouble concentrating - 0  Moving slowly or fidgety/restless - 0  Suicidal thoughts - 0  PHQ-9 Score - 4  Difficult doing work/chores - Not difficult at all     Objective:    Physical Exam Vitals and nursing note reviewed.  Constitutional:      General: She is not in acute distress.    Appearance: Normal appearance. She is not ill-appearing, toxic-appearing or  diaphoretic.  HENT:     Head: Normocephalic and atraumatic.     Right Ear: Tympanic membrane, ear canal and external ear normal.     Left Ear: Tympanic membrane, ear canal and external ear normal.     Mouth/Throat:     Mouth: Mucous membranes are moist.     Pharynx: Oropharynx is clear. No oropharyngeal exudate or posterior oropharyngeal erythema.  Eyes:     General: No scleral icterus.       Right eye: No discharge.        Left eye: No discharge.     Extraocular Movements: Extraocular movements intact.     Conjunctiva/sclera: Conjunctivae normal.     Pupils: Pupils are equal, round, and reactive to light.  Cardiovascular:     Rate and Rhythm: Normal rate and regular rhythm.  Pulmonary:     Effort: Pulmonary effort is normal.     Breath sounds: Normal breath sounds.  Abdominal:     General: Bowel sounds are normal.  Musculoskeletal:     Cervical back: No rigidity or tenderness.     Right lower leg: No edema.     Left lower leg: No edema.  Lymphadenopathy:     Cervical: No cervical adenopathy.  Skin:    General: Skin is warm and dry.  Neurological:     Mental Status: She is alert and oriented to person, place, and time.  Psychiatric:        Mood and Affect: Mood normal.        Behavior: Behavior normal.    BP 104/72 (BP Location: Right Arm, Patient Position: Sitting, Cuff Size: Large)   Pulse (!) 58   Temp (!) 97.4 F (36.3 C) (Temporal)   Ht 5\' 8"  (1.727 m)   Wt 202 lb 12.8 oz (92 kg)   SpO2 98%   BMI 30.84 kg/m  Wt Readings from Last 3 Encounters:  03/29/21 202 lb 12.8 oz (92 kg)  01/27/20 203 lb 11.2 oz (92.4 kg)  11/30/19 202 lb 8 oz (91.9 kg)     Health Maintenance Due  Topic Date Due   Pneumococcal Vaccine 31-27 Years old (1 - PCV) Never done   HIV Screening  Never done   Hepatitis C Screening  Never done    There are no preventive care reminders to display for this patient.  Lab Results  Component Value Date   TSH 2.73 08/19/2017   Lab Results   Component Value Date   WBC 6.0 08/19/2017   HGB 13.4 08/19/2017   HCT 41 08/19/2017   PLT 281 08/19/2017   Lab Results  Component Value Date   NA 140 08/19/2017   K 4.5  08/19/2017   BUN 8 08/19/2017   CREATININE 0.8 08/19/2017   ALKPHOS 51 08/19/2017   AST 21 08/19/2017   ALT 13 08/19/2017   Lab Results  Component Value Date   CHOL 156 08/19/2017   Lab Results  Component Value Date   HDL 65 08/19/2017   Lab Results  Component Value Date   LDLCALC 79 08/19/2017   Lab Results  Component Value Date   TRIG 59 08/19/2017   No results found for: CHOLHDL No results found for: HYWV3X    Assessment & Plan:   Problem List Items Addressed This Visit       Other   Healthcare maintenance - Primary    No orders of the defined types were placed in this encounter.   Follow-up: Return in about 1 year (around 03/29/2022).    Mliss Sax, MD

## 2021-04-05 ENCOUNTER — Other Ambulatory Visit: Payer: Self-pay

## 2021-04-05 ENCOUNTER — Other Ambulatory Visit: Payer: Self-pay | Admitting: Obstetrics and Gynecology

## 2021-04-05 MED ORDER — OSELTAMIVIR PHOSPHATE 75 MG PO CAPS
75.0000 mg | ORAL_CAPSULE | Freq: Two times a day (BID) | ORAL | 0 refills | Status: AC
  Filled 2021-04-05: qty 10, 5d supply, fill #0

## 2021-04-05 NOTE — Progress Notes (Signed)
Rx tamiflu for flu exposure

## 2021-05-12 ENCOUNTER — Other Ambulatory Visit: Payer: Self-pay | Admitting: Obstetrics & Gynecology

## 2021-05-12 DIAGNOSIS — Z9189 Other specified personal risk factors, not elsewhere classified: Secondary | ICD-10-CM

## 2021-05-12 DIAGNOSIS — Z1239 Encounter for other screening for malignant neoplasm of breast: Secondary | ICD-10-CM

## 2021-05-25 ENCOUNTER — Ambulatory Visit
Admission: RE | Admit: 2021-05-25 | Discharge: 2021-05-25 | Disposition: A | Discharge status: Home / Self Care | Payer: 59 | Source: Ambulatory Visit | Attending: Obstetrics & Gynecology | Admitting: Obstetrics & Gynecology

## 2021-05-25 ENCOUNTER — Other Ambulatory Visit: Payer: Self-pay

## 2021-05-25 DIAGNOSIS — Z1239 Encounter for other screening for malignant neoplasm of breast: Secondary | ICD-10-CM | POA: Diagnosis not present

## 2021-05-25 DIAGNOSIS — Z9189 Other specified personal risk factors, not elsewhere classified: Secondary | ICD-10-CM

## 2021-05-25 DIAGNOSIS — R922 Inconclusive mammogram: Secondary | ICD-10-CM | POA: Diagnosis not present

## 2021-05-31 ENCOUNTER — Encounter: Payer: 59 | Admitting: Obstetrics and Gynecology

## 2021-07-27 ENCOUNTER — Other Ambulatory Visit: Payer: Self-pay | Admitting: Obstetrics and Gynecology

## 2021-07-27 DIAGNOSIS — Z3A12 12 weeks gestation of pregnancy: Secondary | ICD-10-CM | POA: Diagnosis not present

## 2021-07-27 DIAGNOSIS — Z1239 Encounter for other screening for malignant neoplasm of breast: Secondary | ICD-10-CM

## 2021-07-27 DIAGNOSIS — R928 Other abnormal and inconclusive findings on diagnostic imaging of breast: Secondary | ICD-10-CM

## 2021-07-27 DIAGNOSIS — Z803 Family history of malignant neoplasm of breast: Secondary | ICD-10-CM

## 2021-07-27 DIAGNOSIS — O09521 Supervision of elderly multigravida, first trimester: Secondary | ICD-10-CM | POA: Diagnosis not present

## 2021-07-27 DIAGNOSIS — Z3401 Encounter for supervision of normal first pregnancy, first trimester: Secondary | ICD-10-CM | POA: Diagnosis not present

## 2021-07-28 DIAGNOSIS — Z3401 Encounter for supervision of normal first pregnancy, first trimester: Secondary | ICD-10-CM | POA: Diagnosis not present

## 2021-07-28 DIAGNOSIS — Z3A12 12 weeks gestation of pregnancy: Secondary | ICD-10-CM | POA: Diagnosis not present

## 2021-07-28 DIAGNOSIS — O09521 Supervision of elderly multigravida, first trimester: Secondary | ICD-10-CM | POA: Diagnosis not present

## 2021-07-28 LAB — CBC/D/PLT+RPR+RH+ABO+RUBIGG...
Antibody Screen: NEGATIVE
Basophils Absolute: 0 10*3/uL (ref 0.0–0.2)
Basos: 0 %
EOS (ABSOLUTE): 0.1 10*3/uL (ref 0.0–0.4)
Eos: 1 %
HCV Ab: NONREACTIVE
HIV Screen 4th Generation wRfx: NONREACTIVE
Hematocrit: 37.9 % (ref 34.0–46.6)
Hemoglobin: 12.8 g/dL (ref 11.1–15.9)
Hepatitis B Surface Ag: NEGATIVE
Immature Grans (Abs): 0 10*3/uL (ref 0.0–0.1)
Immature Granulocytes: 0 %
Lymphocytes Absolute: 2 10*3/uL (ref 0.7–3.1)
Lymphs: 23 %
MCH: 29.4 pg (ref 26.6–33.0)
MCHC: 33.8 g/dL (ref 31.5–35.7)
MCV: 87 fL (ref 79–97)
Monocytes Absolute: 0.5 10*3/uL (ref 0.1–0.9)
Monocytes: 5 %
Neutrophils Absolute: 6.1 10*3/uL (ref 1.4–7.0)
Neutrophils: 71 %
Platelets: 298 10*3/uL (ref 150–450)
RBC: 4.36 x10E6/uL (ref 3.77–5.28)
RDW: 12.7 % (ref 11.7–15.4)
RPR Ser Ql: NONREACTIVE
Rh Factor: POSITIVE
Rubella Antibodies, IGG: <0.9 index — ABNORMAL LOW (ref >0.99)
Varicella zoster IgG: 1138 index (ref >165)
WBC: 8.7 10*3/uL (ref 3.4–10.8)

## 2021-07-28 LAB — HCV INTERPRETATION

## 2021-08-04 LAB — MATERNIT21 PLUS CORE+SCA
Fetal Fraction: 7
Monosomy X (Turner Syndrome): NOT DETECTED
Result (T21): NEGATIVE
Trisomy 13 (Patau syndrome): NEGATIVE
Trisomy 18 (Edwards syndrome): NEGATIVE
Trisomy 21 (Down syndrome): NEGATIVE
XXX (Triple X Syndrome): NOT DETECTED
XXY (Klinefelter Syndrome): NOT DETECTED
XYY (Jacobs Syndrome): NOT DETECTED

## 2021-08-04 LAB — SPECIMEN STATUS REPORT

## 2021-08-08 DIAGNOSIS — N912 Amenorrhea, unspecified: Secondary | ICD-10-CM | POA: Diagnosis not present

## 2021-08-09 DIAGNOSIS — N912 Amenorrhea, unspecified: Secondary | ICD-10-CM | POA: Diagnosis not present

## 2021-09-06 DIAGNOSIS — O0992 Supervision of high risk pregnancy, unspecified, second trimester: Secondary | ICD-10-CM | POA: Diagnosis not present

## 2021-09-06 DIAGNOSIS — Z113 Encounter for screening for infections with a predominantly sexual mode of transmission: Secondary | ICD-10-CM | POA: Diagnosis not present

## 2021-09-20 ENCOUNTER — Other Ambulatory Visit: Payer: Self-pay

## 2021-09-20 DIAGNOSIS — O0992 Supervision of high risk pregnancy, unspecified, second trimester: Secondary | ICD-10-CM | POA: Diagnosis not present

## 2021-09-20 MED ORDER — SUCRALFATE 1 G PO TABS
ORAL_TABLET | ORAL | 11 refills | Status: AC
  Filled 2021-09-20: qty 120, 30d supply, fill #0

## 2021-10-24 ENCOUNTER — Other Ambulatory Visit: Payer: Self-pay

## 2021-10-24 DIAGNOSIS — O0992 Supervision of high risk pregnancy, unspecified, second trimester: Secondary | ICD-10-CM | POA: Diagnosis not present

## 2021-10-24 DIAGNOSIS — O26842 Uterine size-date discrepancy, second trimester: Secondary | ICD-10-CM | POA: Diagnosis not present

## 2021-10-24 DIAGNOSIS — O09512 Supervision of elderly primigravida, second trimester: Secondary | ICD-10-CM | POA: Diagnosis not present

## 2021-10-24 DIAGNOSIS — O99212 Obesity complicating pregnancy, second trimester: Secondary | ICD-10-CM | POA: Diagnosis not present

## 2021-10-24 MED ORDER — BLOOD PRESSURE MONITOR MISC
0 refills | Status: AC
  Filled 2021-10-24: qty 1, 1d supply, fill #0

## 2021-11-14 DIAGNOSIS — Z3A27 27 weeks gestation of pregnancy: Secondary | ICD-10-CM | POA: Diagnosis not present

## 2021-11-14 DIAGNOSIS — O09522 Supervision of elderly multigravida, second trimester: Secondary | ICD-10-CM | POA: Diagnosis not present

## 2021-11-17 DIAGNOSIS — O99810 Abnormal glucose complicating pregnancy: Secondary | ICD-10-CM | POA: Diagnosis not present

## 2023-03-16 IMAGING — MG DIGITAL DIAGNOSTIC BILAT W/ TOMO W/ CAD
8 series · 8 of 24 positions shown · non-contrast
Comparison: Previous exam(s).

CLINICAL DATA: Patient for delayed follow-up of probably benign
asymmetry within the medial right breast.

EXAM:
DIGITAL DIAGNOSTIC BILATERAL MAMMOGRAM WITH TOMOSYNTHESIS AND CAD
TECHNIQUE: Bilateral digital diagnostic mammography and breast tomosynthesis
was performed. The images were evaluated with computer-aided
detection.

[R CC synth-2D]
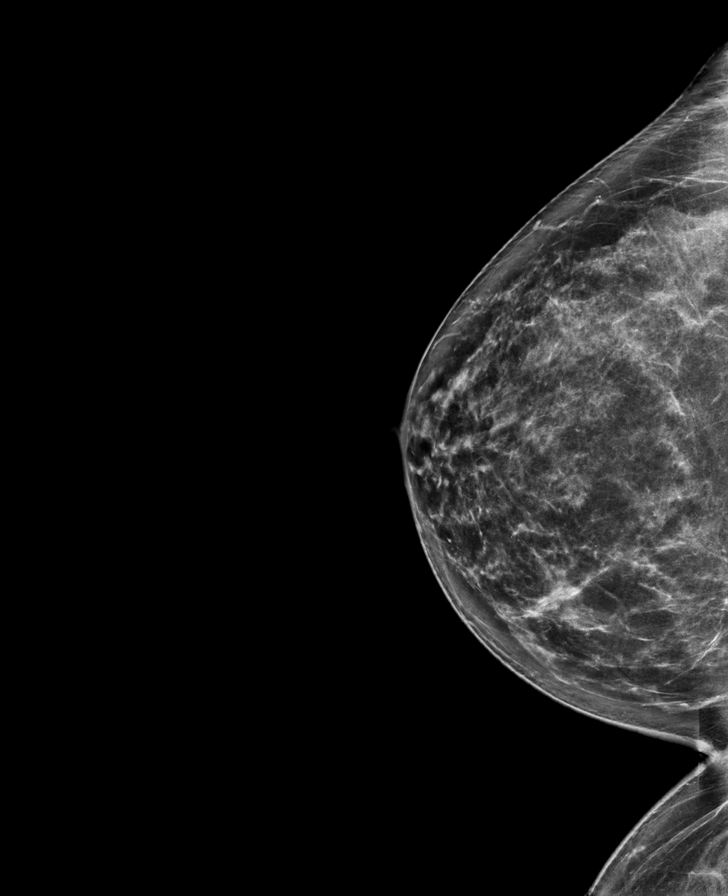

[L CC synth-2D]
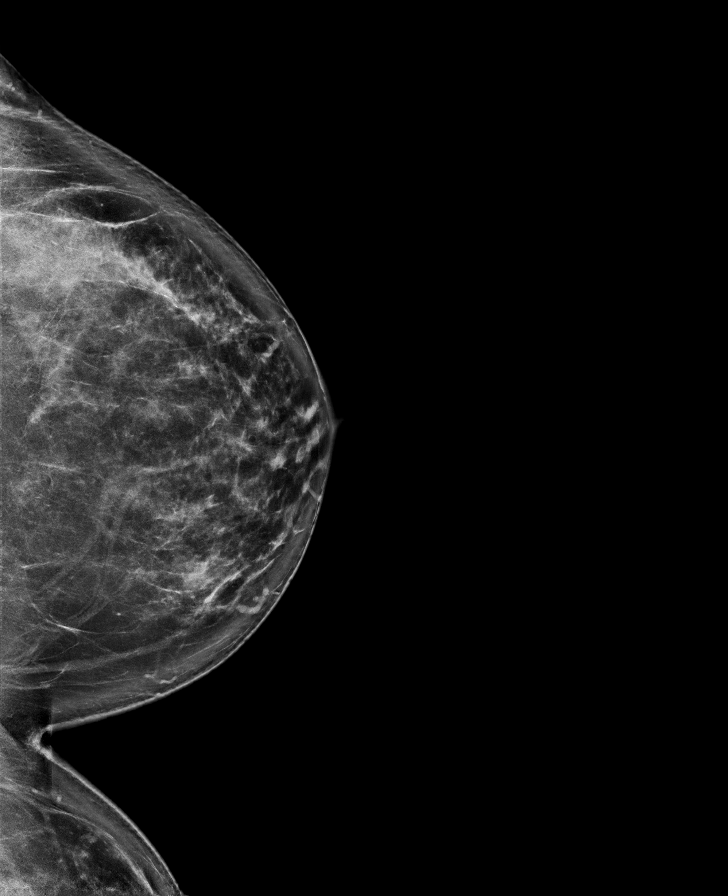

[R MLO synth-2D]
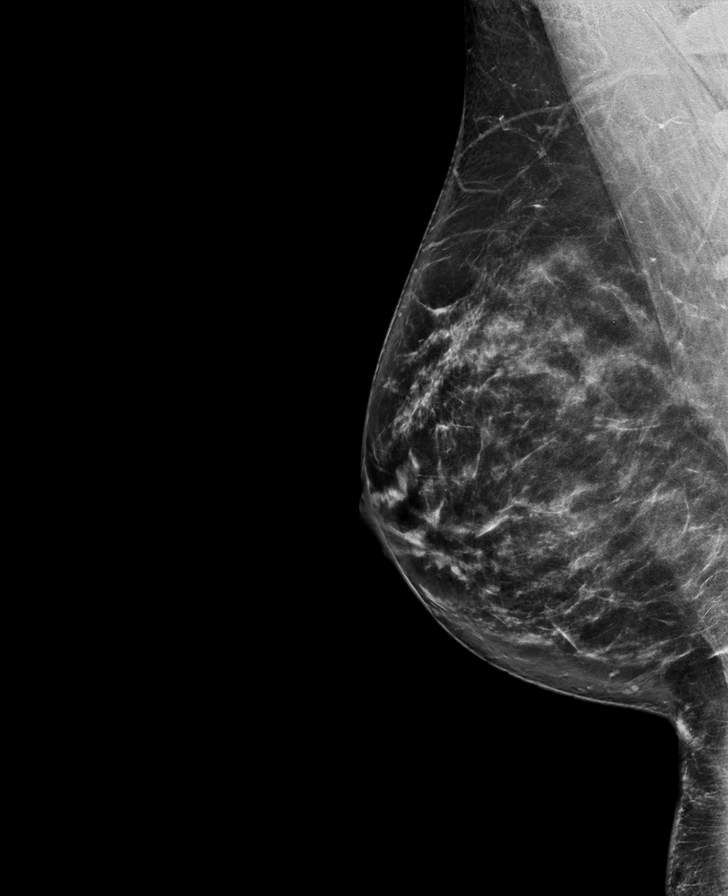

[L MLO synth-2D]
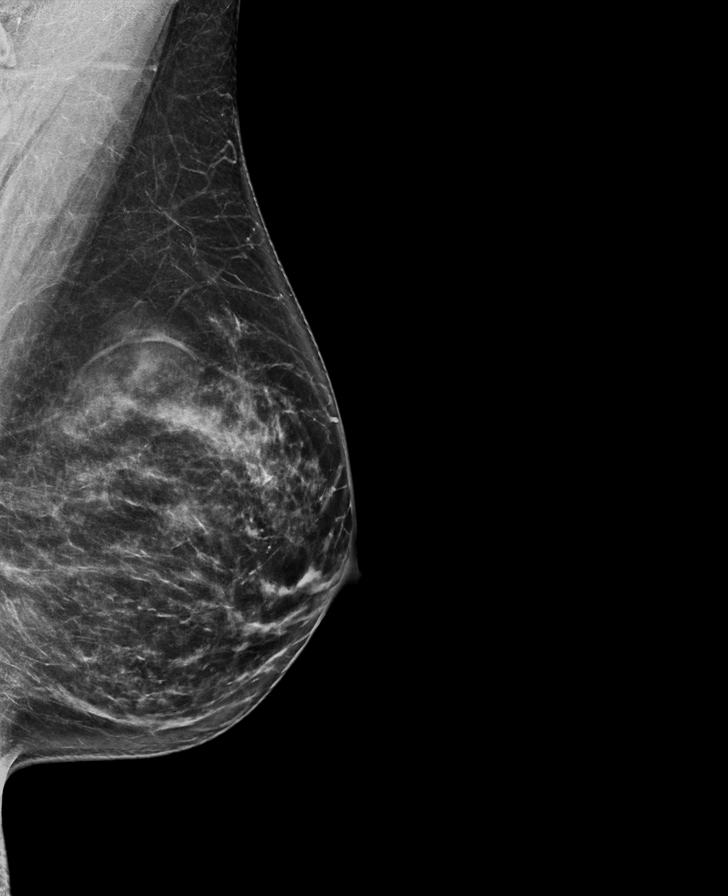

[R CC tomo · tomo slice 44/87.0]
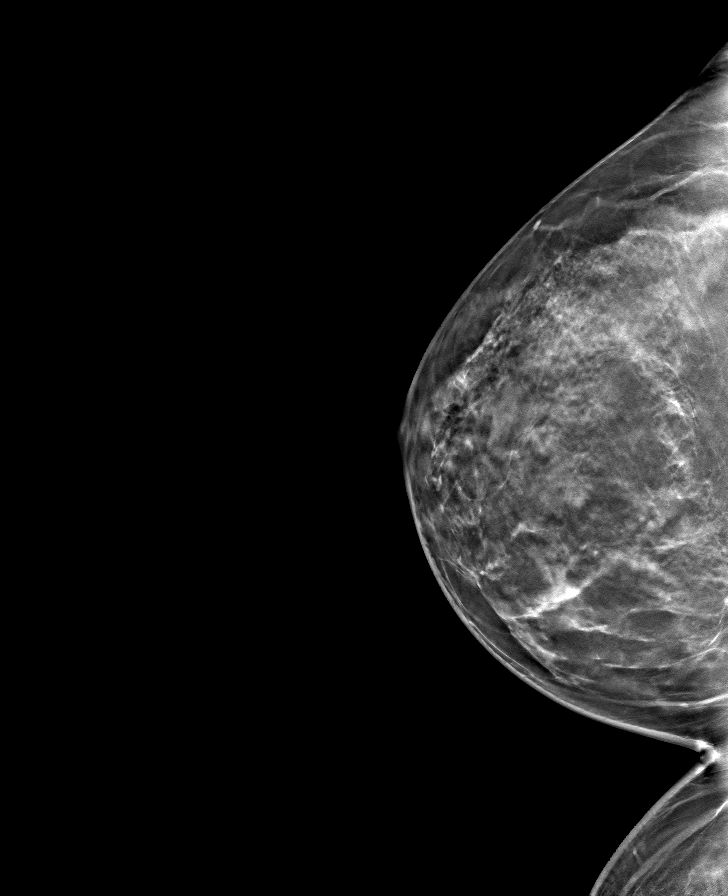

[R MLO tomo · tomo slice 46/91.0]
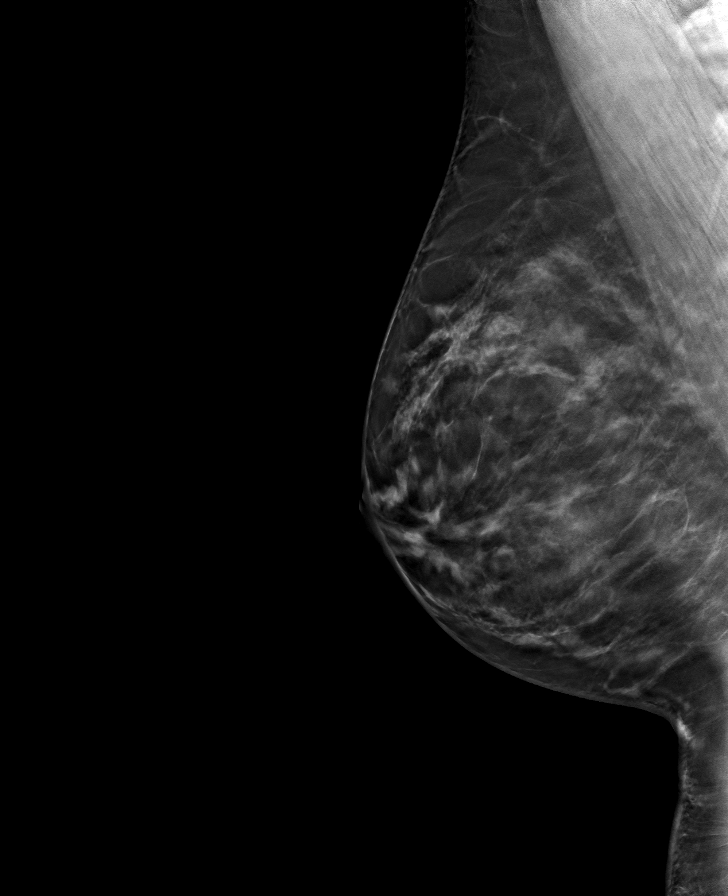

[L MLO tomo · tomo slice 43/85.0]
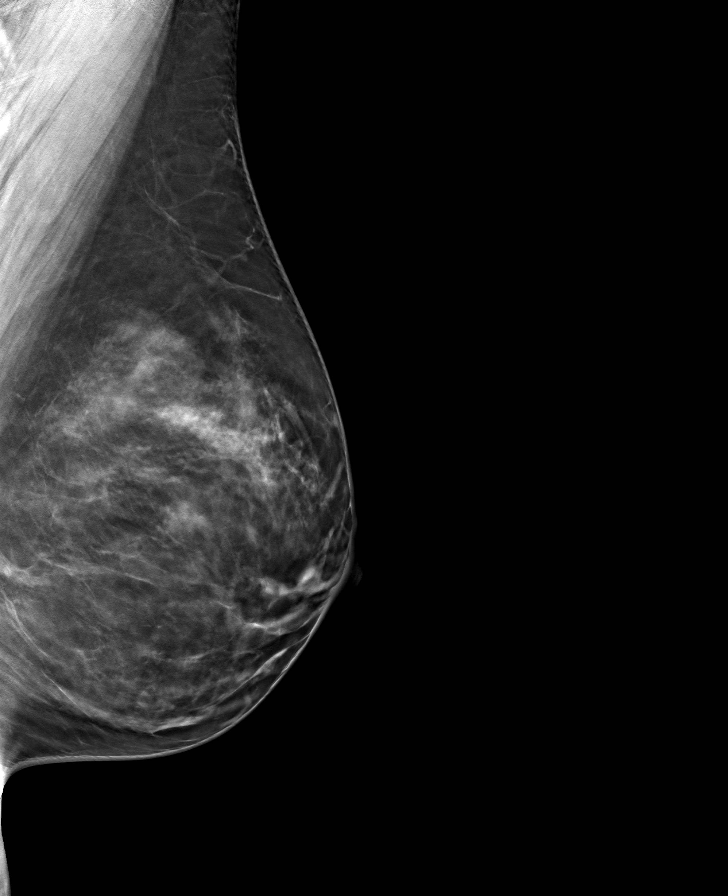

[L CC tomo · tomo slice 47/94.0]
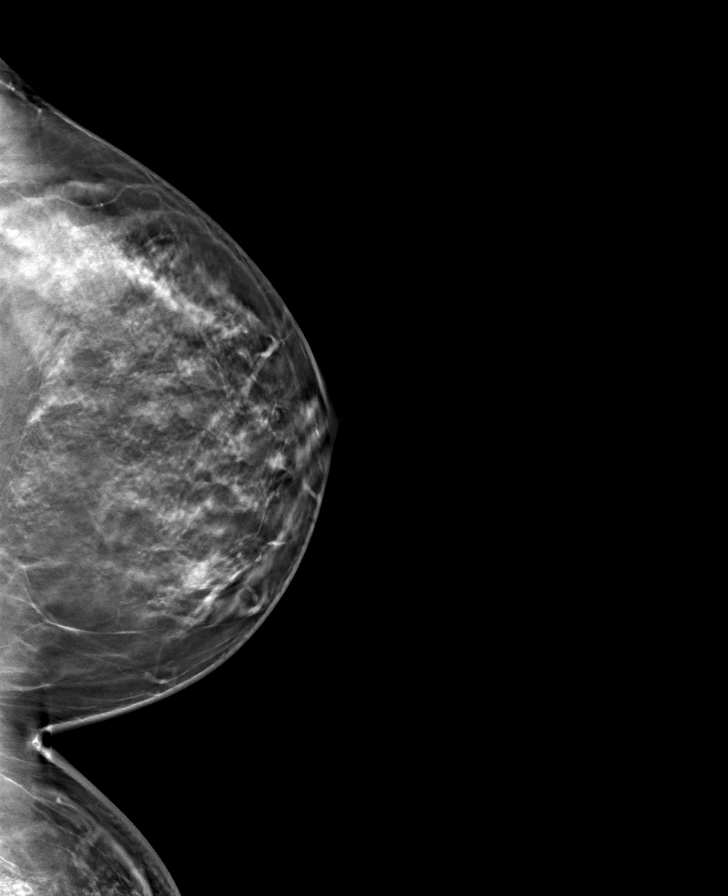

[8 of 24 positions shown; findings below may reference images not displayed]

ACR Breast Density Category c: The breast tissue is heterogeneously
dense, which may obscure small masses.
FINDINGS: Grossly unchanged focal asymmetry medial right breast. No new
masses, calcifications or nonsurgical distortion identified within
either breast.
IMPRESSION: Stable probably benign asymmetry medial right breast. No
mammographic evidence for malignancy.

RECOMMENDATION:
Bilateral diagnostic mammography in 12 months to demonstrate greater
than 2 years of stability of probably benign right breast asymmetry.

I have discussed the findings and recommendations with the patient.
If applicable, a reminder letter will be sent to the patient
regarding the next appointment.

BI-RADS CATEGORY  3: Probably benign.
